# Patient Record
Sex: Male | Born: 1985 | Race: White | Hispanic: No | Marital: Single | Smoking: Never smoker
Health system: Southern US, Community
[De-identification: ages and names within clinical notes are randomized; demographics above are authoritative.]

## PROBLEM LIST (undated history)

## (undated) DIAGNOSIS — S060XAA Concussion with loss of consciousness status unknown, initial encounter: Secondary | ICD-10-CM

## (undated) DIAGNOSIS — S060X9A Concussion with loss of consciousness of unspecified duration, initial encounter: Secondary | ICD-10-CM

## (undated) DIAGNOSIS — D239 Other benign neoplasm of skin, unspecified: Secondary | ICD-10-CM

## (undated) DIAGNOSIS — I861 Scrotal varices: Secondary | ICD-10-CM

## (undated) DIAGNOSIS — K219 Gastro-esophageal reflux disease without esophagitis: Secondary | ICD-10-CM

## (undated) HISTORY — DX: Gastro-esophageal reflux disease without esophagitis: K21.9

## (undated) HISTORY — DX: Other benign neoplasm of skin, unspecified: D23.9

## (undated) HISTORY — DX: Concussion with loss of consciousness status unknown, initial encounter: S06.0XAA

## (undated) HISTORY — DX: Concussion with loss of consciousness of unspecified duration, initial encounter: S06.0X9A

## (undated) HISTORY — DX: Scrotal varices: I86.1

---

## 2009-02-20 ENCOUNTER — Ambulatory Visit: Payer: Self-pay | Admitting: Family Medicine

## 2009-02-20 DIAGNOSIS — D239 Other benign neoplasm of skin, unspecified: Secondary | ICD-10-CM

## 2009-02-20 DIAGNOSIS — K219 Gastro-esophageal reflux disease without esophagitis: Secondary | ICD-10-CM | POA: Insufficient documentation

## 2009-02-20 DIAGNOSIS — I861 Scrotal varices: Secondary | ICD-10-CM

## 2009-02-20 HISTORY — DX: Scrotal varices: I86.1

## 2009-02-20 HISTORY — DX: Other benign neoplasm of skin, unspecified: D23.9

## 2009-02-20 HISTORY — DX: Gastro-esophageal reflux disease without esophagitis: K21.9

## 2010-02-10 ENCOUNTER — Encounter: Payer: Self-pay | Admitting: Family Medicine

## 2010-02-10 ENCOUNTER — Ambulatory Visit: Payer: Self-pay | Admitting: Family Medicine

## 2010-02-12 LAB — CONVERTED CEMR LAB
AST: 27 units/L (ref 0–37)
Alkaline Phosphatase: 58 units/L (ref 39–117)
Basophils Absolute: 0 10*3/uL (ref 0.0–0.1)
Basophils Relative: 0.5 % (ref 0.0–3.0)
Bilirubin, Direct: 0.1 mg/dL (ref 0.0–0.3)
CO2: 30 meq/L (ref 19–32)
Calcium: 9.9 mg/dL (ref 8.4–10.5)
Creatinine, Ser: 1 mg/dL (ref 0.4–1.5)
Eosinophils Absolute: 0.2 10*3/uL (ref 0.0–0.7)
GC Probe Amp, Urine: NEGATIVE
GFR calc non Af Amer: 100.18 mL/min (ref >60.00)
HDL: 45.7 mg/dL (ref >39.00)
HIV: NONREACTIVE
Lymphocytes Relative: 31.7 % (ref 12.0–46.0)
MCHC: 34.8 g/dL (ref 30.0–36.0)
Monocytes Relative: 6.9 % (ref 3.0–12.0)
Neutrophils Relative %: 58.4 % (ref 43.0–77.0)
RBC: 4.83 M/uL (ref 4.22–5.81)
RDW: 12.7 % (ref 11.5–14.6)
Total CHOL/HDL Ratio: 4
Triglycerides: 51 mg/dL (ref 0.0–149.0)
VLDL: 10.2 mg/dL (ref 0.0–40.0)

## 2010-03-27 NOTE — Assessment & Plan Note (Signed)
Summary: cpx---will fast//ccm   Vital Signs:  Patient profile:   25 year old male Weight:      158 pounds BMI:     11.47 Temp:     68.25 degrees F oral Pulse rate:   80 / minute Pulse rhythm:   regular Resp:     12 per minute BP sitting:   102 / 80  (left arm) Cuff size:   regular  Vitals Entered By: Sid Falcon LPN (February 10, 2010 11:30 AM) CC: CPX   History of Present Illness: Here for CPE.  just returned from trip to [place]. Had transient fever there but none since then. Received hepatitis A, typhoid vaccine, and yellow fever vaccine prior to departure. Last tetanus 2007. Took malaria prophylaxis but had intolerance and does not take consistently during the trip.  Patient takes no regular medications. History of mild GERD in past. Request STD testing. No discharge or rash  Clinical Review Panels:  Immunizations   Last Tetanus Booster:  Historical (02/23/2005)   Allergies (verified): No Known Drug Allergies  Past History:  Past Medical History: Last updated: 02/20/2009 Chicken pox GERD/Heartburn [facility]  Family History: Last updated: 02/20/2009 Family History of Arthritis, both parents Breast cancer, grandmother Elevated cholesterol Sudden death, grandfather Emotional illness, father Diabetes, father  Social History: Last updated: 02/20/2009 Occupation:  Archivist Single Never Smoked Alcohol use-yes Regular exercise-yes  Risk Factors: Exercise: yes (02/20/2009)  Risk Factors: Smoking Status: never (02/20/2009) PMH-FH-SH reviewed for relevance  Review of Systems  The patient denies anorexia, fever, weight loss, weight gain, vision loss, decreased hearing, hoarseness, chest pain, syncope, dyspnea on exertion, peripheral edema, prolonged cough, headaches, hemoptysis, abdominal pain, melena, hematochezia, severe indigestion/heartburn, hematuria, incontinence, genital sores, muscle weakness, suspicious skin lesions, transient  blindness, difficulty walking, depression, unusual weight change, abnormal bleeding, enlarged lymph nodes, and testicular masses.    Physical Exam  General:  Well-developed,well-nourished,in no acute distress; alert,appropriate and cooperative throughout examination Head:  Normocephalic and atraumatic without obvious abnormalities. No apparent alopecia or balding. Eyes:  No corneal or conjunctival inflammation noted. EOMI. Perrla. Funduscopic exam benign, without hemorrhages, exudates or papilledema. Vision grossly normal. Ears:  External ear exam shows no significant lesions or deformities.  Otoscopic examination reveals clear canals, tympanic membranes are intact bilaterally without bulging, retraction, inflammation or discharge. Hearing is grossly normal bilaterally. Mouth:  Oral mucosa and oropharynx without lesions or exudates.  Teeth in good repair. Neck:  No deformities, masses, or tenderness noted. Lungs:  Normal respiratory effort, chest expands symmetrically. Lungs are clear to auscultation, no crackles or wheezes. Heart:  Normal rate and regular rhythm. S1 and S2 normal without gallop, murmur, click, rub or other extra sounds. Abdomen:  Bowel sounds positive,abdomen soft and non-tender without masses, organomegaly or hernias noted. Genitalia:  Testes bilaterally descended without nodularity, tenderness or masses. No scrotal masses or lesions. No penis lesions or urethral discharge. Msk:  No deformity or scoliosis noted of thoracic or lumbar spine.   Extremities:  No clubbing, cyanosis, edema, or deformity noted with normal full range of motion of all joints.   Neurologic:  alert & oriented X3, cranial nerves II-XII intact, and strength normal in all extremities.   Skin:  no rashes and no suspicious lesions.   Cervical Nodes:  No lymphadenopathy noted Psych:  normally interactive, good eye contact, not anxious appearing, and not depressed appearing.     Impression &  Recommendations:  Problem # 1:  Preventive Health Care (ICD-V70.0) obtain screening labs  and add STD screening test. Discussed prevention of STD  Other Orders: TLB-Lipid Panel (80061-LIPID) TLB-BMP (Basic Metabolic Panel-BMET) (80048-METABOL) TLB-CBC Platelet - w/Differential (85025-CBCD) TLB-Hepatic/Liver Function Pnl (80076-HEPATIC) TLB-TSH (Thyroid Stimulating Hormone) (84443-TSH) T-HIV Antibody  (Reflex) [id number]) T-RPR (Syphilis) (69629-52841) T-GC Probe, urine (32440-10272) T-Chlamydia  Probe, urine (53664-40347) Specimen Handling (42595) Venipuncture (63875)   Orders Added: 1)  Est. Patient 18-39 years [99395] 2)  TLB-Lipid Panel [80061-LIPID] 3)  TLB-BMP (Basic Metabolic Panel-BMET) [80048-METABOL] 4)  TLB-CBC Platelet - w/Differential [85025-CBCD] 5)  TLB-Hepatic/Liver Function Pnl [80076-HEPATIC] 6)  TLB-TSH (Thyroid Stimulating Hormone) [84443-TSH] 7)  T-HIV Antibody  (Reflex) [[id number]] 8)  T-RPR (Syphilis) [41660-63016] 9)  T-GC Probe, urine [phone] 10)  T-Chlamydia  Probe, urine [[id number]] 11)  Specimen Handling [99000] 12)  Venipuncture [62376]

## 2011-06-19 ENCOUNTER — Ambulatory Visit (INDEPENDENT_AMBULATORY_CARE_PROVIDER_SITE_OTHER): Payer: 59 | Admitting: Family Medicine

## 2011-06-19 ENCOUNTER — Encounter: Payer: Self-pay | Admitting: Family Medicine

## 2011-06-19 DIAGNOSIS — S060XAA Concussion with loss of consciousness status unknown, initial encounter: Secondary | ICD-10-CM

## 2011-06-19 DIAGNOSIS — S060X9A Concussion with loss of consciousness of unspecified duration, initial encounter: Secondary | ICD-10-CM

## 2011-06-19 NOTE — Patient Instructions (Signed)
Concussion and Brain Injury A blow or jolt to the head can disrupt the normal function of the brain. This type of brain injury is often called a "concussion" or a "closed head injury." Concussions are usually not life-threatening. Even so, the effects of a concussion can be serious.  CAUSES  A concussion is caused by a blunt blow to the head. The blow might be direct or indirect as described below.  Direct blow (running into another player during a soccer game, being hit in a fight, or hitting your head on a hard surface).   Indirect blow (when your head moves rapidly and violently back and forth like in a car crash).  SYMPTOMS  The brain is very complex. Every head injury is different. Some symptoms may appear right away. Other symptoms may not show up for days or weeks after the concussion. The signs of concussion can be hard to notice. Early on, problems may be missed by patients, family members, and caregivers. You may look fine even though you are acting or feeling differently.  These symptoms are usually temporary, but may last for days, weeks, or even longer. Symptoms include:  Mild headaches that will not go away.   Having more trouble than usual with:   Remembering things.   Paying attention or concentrating.   Organizing daily tasks.   Making decisions and solving problems.   Slowness in thinking, acting, speaking, or reading.   Getting lost or easily confused.   Feeling tired all the time or lacking energy (fatigue).   Feeling drowsy.   Sleep disturbances.   Sleeping more than usual.   Sleeping less than usual.   Trouble falling asleep.   Trouble sleeping (insomnia).   Loss of balance or feeling lightheaded or dizzy.   Nausea or vomiting.   Numbness or tingling.   Increased sensitivity to:   Sounds.   Lights.   Distractions.  Other symptoms might include:  Vision problems or eyes that tire easily.   Diminished sense of taste or smell.   Ringing  in the ears.   Mood changes such as feeling sad, anxious, or listless.   Becoming easily irritated or angry for little or no reason.   Lack of motivation.  DIAGNOSIS  Your caregiver can usually diagnose a concussion or mild brain injury based on your description of your injury and your symptoms.  Your evaluation might include:  A brain scan to look for signs of injury to the brain. Even if the test shows no injury, you may still have a concussion.   Blood tests to be sure other problems are not present.  TREATMENT   People with a concussion need to be examined and evaluated. Most people with concussions are treated in an emergency department, urgent care, or clinic. Some people must stay in the hospital overnight for further treatment.   Your caregiver will send you home with important instructions to follow. Be sure to carefully follow them.   Tell your caregiver if you are already taking any medicines (prescription, over-the-counter, or natural remedies), or if you are drinking alcohol or taking illegal drugs. Also, talk with your caregiver if you are taking blood thinners (anticoagulants) or aspirin. These drugs may increase your chances of complications. All of this is important information that may affect treatment.   Only take over-the-counter or prescription medicines for pain, discomfort, or fever as directed by your caregiver.  PROGNOSIS  How fast people recover from brain injury varies from person to person.   Although most people have a good recovery, how quickly they improve depends on many factors. These factors include how severe their concussion was, what part of the brain was injured, their age, and how healthy they were before the concussion.  Because all head injuries are different, so is recovery. Most people with mild injuries recover fully. Recovery can take time. In general, recovery is slower in older persons. Also, persons who have had a concussion in the past or have  other medical problems may find that it takes longer to recover from their current injury. Anxiety and depression may also make it harder to adjust to the symptoms of brain injury. HOME CARE INSTRUCTIONS  Return to your normal activities slowly, not all at once. You must give your body and brain enough time for recovery.  Get plenty of sleep at night, and rest during the day. Rest helps the brain to heal.   Avoid staying up late at night.   Keep the same bedtime hours on weekends and weekdays.   Take daytime naps or rest breaks when you feel tired.   Limit activities that require a lot of thought or concentration (brain or cognitive rest). This includes:   Homework or job-related work.   Watching TV.   Computer work.   Avoid activities that could lead to a second brain injury, such as contact or recreational sports, until your caregiver says it is okay. Even after your brain injury has healed, you should protect yourself from having another concussion.   Ask your caregiver when you can return to your normal activities such as driving, bicycling, or operating heavy equipment. Your ability to react may be slower after a brain injury.   Talk with your caregiver about when you can return to work or school.   Inform your teachers, school nurse, school counselor, coach, athletic trainer, or work manager about your injury, symptoms, and restrictions. They should be instructed to report:   Increased problems with attention or concentration.   Increased problems remembering or learning new information.   Increased time needed to complete tasks or assignments.   Increased irritability or decreased ability to cope with stress.   Increased symptoms.   Take only those medicines that your caregiver has approved.   Do not drink alcohol until your caregiver says you are well enough to do so. Alcohol and certain other drugs may slow your recovery and can put you at risk of further injury.    If it is harder than usual to remember things, write them down.   If you are easily distracted, try to do one thing at a time. For example, do not try to watch TV while fixing dinner.   Talk with family members or close friends when making important decisions.   Keep all follow-up appointments. Repeated evaluation of your symptoms is recommended for your recovery.  PREVENTION  Protect your head from future injury. It is very important to avoid another head or brain injury before you have recovered. In rare cases, another injury has lead to permanent brain damage, brain swelling, or death. Avoid injuries by using:  Seatbelts when riding in a car.   Alcohol only in moderation.   A helmet when biking, skiing, skateboarding, skating, or doing similar activities.   Safety measures in your home.   Remove clutter and tripping hazards from floors and stairways.   Use grab bars in bathrooms and handrails by stairs.   Place non-slip mats on floors and in bathtubs.     Improve lighting in dim areas.  SEEK MEDICAL CARE IF:  A head injury can cause lingering symptoms. You should seek medical care if you have any of the following symptoms for more than 3 weeks after your injury or are planning to return to sports:  Chronic headaches.   Dizziness or balance problems.   Nausea.   Vision problems.   Increased sensitivity to noise or light.   Depression or mood swings.   Anxiety or irritability.   Memory problems.   Difficulty concentrating or paying attention.   Sleep problems.   Feeling tired all the time.  SEEK IMMEDIATE MEDICAL CARE IF:  You have had a blow or jolt to the head and you (or your family or friends) notice:  Severe or worsening headaches.   Weakness (even if only in one hand or one leg or one part of the face), numbness, or decreased coordination.   Repeated vomiting.   Increased sleepiness or passing out.   One black center of the eye (pupil) is larger  than the other.   Convulsions (seizures).   Slurred speech.   Increasing confusion, restlessness, agitation, or irritability.   Lack of ability to recognize people or places.   Neck pain.   Difficulty being awakened.   Unusual behavior changes.   Loss of consciousness.  Older adults with a brain injury may have a higher risk of serious complications such as a blood clot on the brain. Headaches that get worse or an increase in confusion are signs of this complication. If these signs occur, see a caregiver right away. MAKE SURE YOU:   Understand these instructions.   Will watch your condition.   Will get help right away if you are not doing well or get worse.  FOR MORE INFORMATION  Several groups help people with brain injury and their families. They provide information and put people in touch with local resources. These include support groups, rehabilitation services, and a variety of health care professionals. Among these groups, the Brain Injury Association (BIA, www.biausa.org) has a national office that gathers scientific and educational information and works on a national level to help people with brain injury.  Document Released: 05/02/2003 Document Revised: 01/29/2011 Document Reviewed: 09/28/2007 [employer] Patient Information 2012 [employer]. 

## 2011-06-19 NOTE — Progress Notes (Signed)
  Subjective:    Patient ID: Omar Green, male    DOB: May 05, 1985, 26 y.o.   MRN: [id number]  HPI  Acute visit for head trauma. Participating in a martial arts class last night and fell back striking occipital region of head against the floor. No loss of consciousness. Felt somewhat dazed afterwards. No amnesia. Since then has had some fatigue and slow cognitive function. He had some headache this morning but none currently. He denies any nausea, vomiting, visual difficulties, seizure, or any other focal weakness. He has some generalized neck soreness but none specifically localized over the spine. No prior history of concussion. He tried some reading and studying today but feels slower cognitively. He has not taken anything for pain.  Past medical history is unremarkable. No chronic medical problems. Takes no regular medications.  Review of Systems  Constitutional: Negative for appetite change.  HENT: Negative for neck stiffness.   Eyes: Negative for visual disturbance.  Respiratory: Negative for shortness of breath.   Cardiovascular: Negative for chest pain.  Genitourinary: Negative for dysuria.  Neurological: Negative for seizures, syncope, weakness and light-headedness.  Hematological: Negative for adenopathy.  Psychiatric/Behavioral: Negative for confusion.       Objective:   Physical Exam  Constitutional: He is oriented to person, place, and time. He appears well-developed and well-nourished.  HENT:  Head: Normocephalic and atraumatic.  Right Ear: External ear normal.  Left Ear: External ear normal.  Mouth/Throat: Oropharynx is clear and moist.  Neck: Neck supple. No thyromegaly present.  Cardiovascular: Normal rate and regular rhythm.   Pulmonary/Chest: Effort normal and breath sounds normal.  Lymphadenopathy:    He has no cervical adenopathy.  Neurological: He is alert and oriented to person, place, and time. No cranial nerve deficit. Coordination normal.  Psychiatric: He  has a normal mood and affect. His behavior is normal. Judgment and thought content normal.          Assessment & Plan:  Status post concussion. Patient has some residual symptoms of fatigue and slowed cognitive function. Nonfocal neurologic exam. Handout concussion given. We've advised physical and mental rest for the next several days and have given him guidance for gradually advancing activities. No contact sports until symptoms are fully resolved for several days. Follow up promptly for any worsening headache, vomiting, seizure, or any worsening symptoms

## 2011-06-22 ENCOUNTER — Telehealth: Payer: Self-pay | Admitting: Family Medicine

## 2011-06-22 NOTE — Telephone Encounter (Signed)
If no headache, vomiting,or any new neurologic symptoms no further evaluation needed at this time.  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

## 2011-06-22 NOTE — Telephone Encounter (Signed)
Has questions about her son and is requesting to be contacted

## 2011-06-22 NOTE — Telephone Encounter (Signed)
Pt was seen last week for head injury.  He has seemed sleepy lately.  Mother wondering if there is any other follow-up that is required for his injury.  Other than sleepy, he seems normal to mother.

## 2011-06-22 NOTE — Telephone Encounter (Signed)
Pt mother informed

## 2011-07-10 ENCOUNTER — Encounter: Payer: Self-pay | Admitting: Family Medicine

## 2011-07-10 ENCOUNTER — Ambulatory Visit (INDEPENDENT_AMBULATORY_CARE_PROVIDER_SITE_OTHER): Payer: 59 | Admitting: Family Medicine

## 2011-07-10 ENCOUNTER — Telehealth: Payer: Self-pay | Admitting: Family Medicine

## 2011-07-10 VITALS — BP 90/60 | Temp 98.2°F | Wt 156.0 lb

## 2011-07-10 DIAGNOSIS — F0781 Postconcussional syndrome: Secondary | ICD-10-CM

## 2011-07-10 NOTE — Patient Instructions (Signed)
Touch base by next week if symptoms do not continue to improve.

## 2011-07-10 NOTE — Telephone Encounter (Signed)
Please advise 

## 2011-07-10 NOTE — Progress Notes (Signed)
  Subjective:    Patient ID: Omar Green, male    DOB: [birth year], 26 y.o.   MRN: [id number]  HPI  Followup from recent concussion. Refer to prior note. Patient had injury during martial arts class. No loss of consciousness. He had some very mild bifrontal headaches which are inconsistent and not daily. He feels he still has some slow cognitive function but overall this is improving. He has not done any extreme exertion but has been some mild exertion which seems to exacerbate his symptoms. Generally had poor sleep habits onset. No nausea or vomiting. No major confusion. No focal weakness. He has not taken anything for sleep. He has not engaged in any high risk contact activities and knows to avoid this.  Past Medical History  Diagnosis Date  . 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 GERD 02/20/2009    Qualifier: Diagnosis of  By: Gabriel Rung LPN, Harriett Sine     No past surgical history on file.  reports that he has never smoked. He does not have any smokeless tobacco history on file. His alcohol and drug histories not on file. family history includes Arthritis in his father and mother; Diabetes in his father; Mental illness in his father; and Stroke in his other. No Known Allergies   Review of Systems  Constitutional: Negative for appetite change and unexpected weight change.  Respiratory: Negative for shortness of breath.   Gastrointestinal: Negative for nausea and vomiting.  Neurological: Positive for headaches (infrequent bifrontal headaches which are mild). Negative for dizziness, seizures, syncope and weakness.  Psychiatric/Behavioral: Negative for confusion.       Objective:   Physical Exam  Constitutional: He is oriented to person, place, and time. He appears well-developed and well-nourished.  Eyes: Pupils are equal, round, and reactive to light.  Cardiovascular:  Normal rate and regular rhythm.   Pulmonary/Chest: Effort normal and breath sounds normal. No respiratory distress. He has no wheezes.  Neurological: He is alert and oriented to person, place, and time. He has normal reflexes. No cranial nerve deficit. Coordination normal.          Assessment & Plan:  Postconcussive symptoms. Nonfocal neurologic exam. He has some associated mild sleep disturbance. We have reiterated the importance of avoiding extreme exertion either mentally or physically. We have suggested consideration for short-term trial of try cyclic such as Elavil but at this point he wishes to wait since he is showing some improvement. We did not see any clear reason for CT scan at this time as he has not had any new symptoms. He is improving, though slowly

## 2011-07-10 NOTE — Telephone Encounter (Signed)
Pt was seen a few weeks ago for a concussion and is still having symptoms wants to know if he should have a scan done.

## 2011-07-10 NOTE — Telephone Encounter (Signed)
What are his symptoms?

## 2011-07-10 NOTE — Telephone Encounter (Signed)
Pt having symptoms when driving mostly when dizziness when driving, any exertion causes.  Pt is going to try to find a ride for a 3:30 appt this afternoon.  If he cannot make it, he will cancel and come in next week.

## 2012-01-28 ENCOUNTER — Ambulatory Visit (INDEPENDENT_AMBULATORY_CARE_PROVIDER_SITE_OTHER): Payer: BC Managed Care – PPO | Admitting: Family Medicine

## 2012-01-28 ENCOUNTER — Encounter: Payer: Self-pay | Admitting: Family Medicine

## 2012-01-28 VITALS — BP 98/70 | Temp 98.3°F | Wt 164.0 lb

## 2012-01-28 DIAGNOSIS — K602 Anal fissure, unspecified: Secondary | ICD-10-CM

## 2012-01-28 DIAGNOSIS — F0781 Postconcussional syndrome: Secondary | ICD-10-CM

## 2012-01-28 MED ORDER — NORTRIPTYLINE HCL 10 MG PO CAPS: 10.0000 mg | ORAL_CAPSULE | Freq: Every day | ORAL | Status: DC

## 2012-01-28 NOTE — Patient Instructions (Addendum)
Anal Fissure, Adult An anal fissure is a small tear or crack in the skin around the anus. Bleeding from a fissure usually stops on its own within a few minutes. However, bleeding will often reoccur with each bowel movement until the crack heals.  CAUSES   Passing large, hard stools.  Frequent diarrheal stools.  Constipation.  Inflammatory bowel disease (Crohn's disease or ulcerative colitis).  Infections.  Anal sex. SYMPTOMS   Small amounts of blood seen on your stools, on toilet paper, or in the toilet after a bowel movement.  Rectal bleeding.  Painful bowel movements.  Itching or irritation around the anus. DIAGNOSIS Your caregiver will examine the anal area. An anal fissure can usually be seen with careful inspection. A rectal exam may be performed and a short tube (anoscope) may be used to examine the anal canal. TREATMENT   You may be instructed to take fiber supplements. These supplements can soften your stool to help make bowel movements easier.  Sitz baths may be recommended to help heal the tear. Do not use soap in the sitz baths.  A medicated cream or ointment may be prescribed to lessen discomfort. HOME CARE INSTRUCTIONS   Maintain a diet high in fruits, whole grains, and vegetables. Avoid constipating foods like bananas and dairy products.  Take sitz baths as directed by your caregiver.  Drink enough fluids to keep your urine clear or pale yellow.  Only take over-the-counter or prescription medicines for pain, discomfort, or fever as directed by your caregiver. Do not take aspirin as this may increase bleeding.  Do not use ointments containing numbing medications (anesthetics) or hydrocortisone. They could slow healing. SEEK MEDICAL CARE IF:   Your fissure is not completely healed within 3 days.  You have further bleeding.  You have a fever.  You have diarrhea mixed with blood.  You have pain.  Your problem is getting worse rather than  better. MAKE SURE YOU:   Understand these instructions.  Will watch your condition.  Will get help right away if you are not doing well or get worse. Document Released: 02/09/2005 Document Revised: 05/04/2011 Document Reviewed: 07/27/2010 [facility] Patient Information 2013 [place], [place].  If doing the above and still straining, consider stool softener.  Take nortriptyline at night. Touch base in 2-3 weeks if headaches no better.   If headaches resolved with medication, consider stopping in 6-8 weeks.

## 2012-01-28 NOTE — Progress Notes (Signed)
  Subjective:    Patient ID: Omar Green, male    DOB: 26-Apr-1985, 26 y.o.   MRN: [id number]  HPI  Patient seen for the following issues  Bright red blood per rectum for one month. Intermittent. Occasional mild constipation. Occasional mild pain with stools. No appetite changes. Some recent mild weight gain. No abdominal pain. No aspirin use. No history of external hemorrhoids  Patient had concussion last spring with martial arts injury. Overall improving but still is having some occasional headaches sometimes several days consecutive. He has not had any vomiting. No focal neurologic symptoms. He had some slow thinking and cognitive impairment initially and this has gradually improved. Sometimes relieved with Tylenol. Fairly diffuse.  Past Medical History  Diagnosis Date  . 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 GERD 02/20/2009    Qualifier: Diagnosis of  By: Gabriel Rung LPN, Harriett Sine     No past surgical history on file.  reports that he has never smoked. He does not have any smokeless tobacco history on file. His alcohol and drug histories not on file. family history includes Arthritis in his father and mother; Diabetes in his father; Mental illness in his father; and Stroke in his other. No Known Allergies   Review of Systems  Constitutional: Negative for fever, chills, appetite change and fatigue.  Respiratory: Negative for cough and shortness of breath.   Cardiovascular: Negative for chest pain.  Gastrointestinal: Positive for blood in stool. Negative for nausea, vomiting, diarrhea and abdominal distention.  Genitourinary: Negative for hematuria.  Neurological: Positive for headaches. Negative for dizziness, tremors, seizures, syncope, weakness and numbness.  Hematological: Negative for adenopathy. Does not bruise/bleed easily.  Psychiatric/Behavioral: Negative for  dysphoric mood.       Objective:   Physical Exam  Constitutional: He is oriented to person, place, and time. He appears well-developed and well-nourished.  Cardiovascular: Normal rate and regular rhythm.   Pulmonary/Chest: Effort normal and breath sounds normal. No respiratory distress. He has no wheezes. He has no rales.  Genitourinary:       Patient has anal fissure 12:00 position. Slight bleeding at this time which is bright red. Did not attempt digital exam. No external hemorrhoids.  Neurological: He is alert and oriented to person, place, and time. No cranial nerve deficit.  Psychiatric: He has a normal mood and affect. His behavior is normal. Judgment and thought content normal.          Assessment & Plan:  #1 anal fissure. Consider sitz baths. Measures to reduce constipation. Consider short-term use of stool softener. Avoid topical steroid creams which could delay healing. No aspirin use. Touch base 2-3 weeks if not resolving. He has minimal pain so no topical medications such as nitroglycerin or diltiazem given  #2 history of concussion. Overall improved but still having frequent headaches which we think might be related vs chronic tension type headache. Nonfocal neurologic history and exam. We discussed avoidance of daily analgesics. Try short-term use of nortriptyline 10 mg each bedtime and touch base 2-3 weeks if headaches not resolving

## 2012-02-15 ENCOUNTER — Telehealth: Payer: Self-pay | Admitting: Family Medicine

## 2012-02-15 NOTE — Telephone Encounter (Signed)
Patient was placed on Nortriptyline 10mg  and was instructed to follow up with provider in 2 weeks to provide update on how the medication is working.  Patient states improvement on medication and wants to know if he needs to continue medication longer or just complete the remainder of previous script. Patient has 16 capsules remaining.   OFFICE NOTE: PLEASE FOLLOW UP WITH PATIENT

## 2012-02-15 NOTE — Telephone Encounter (Signed)
I would plan to take for at least 2-3 months total and then try to leave off.  There would not be any weaning at that time since this is lowest dose.

## 2012-02-16 NOTE — Telephone Encounter (Signed)
Pt informed on personally identified VM 

## 2012-04-21 ENCOUNTER — Ambulatory Visit (INDEPENDENT_AMBULATORY_CARE_PROVIDER_SITE_OTHER): Payer: BC Managed Care – PPO | Admitting: Family Medicine

## 2012-04-21 ENCOUNTER — Encounter: Payer: Self-pay | Admitting: Family Medicine

## 2012-04-21 VITALS — BP 120/71 | HR 80 | Ht 70.0 in | Wt 155.0 lb

## 2012-04-21 DIAGNOSIS — M79609 Pain in unspecified limb: Secondary | ICD-10-CM

## 2012-04-21 DIAGNOSIS — R269 Unspecified abnormalities of gait and mobility: Secondary | ICD-10-CM

## 2012-04-21 DIAGNOSIS — M2140 Flat foot [pes planus] (acquired), unspecified foot: Secondary | ICD-10-CM

## 2012-04-21 DIAGNOSIS — M79604 Pain in right leg: Secondary | ICD-10-CM

## 2012-04-21 NOTE — Patient Instructions (Addendum)
Use new orthotics in as many shoes as possible. Tylenol and/or ibuprofen as needed for pain. Ice as needed 15 minutes at a time. Do the straight leg raise, straight leg raise with foot turned out, hip side raise, decline squats, and hacky sack exercises 3 sets of 10 once a day. Can add ankle weights if needed. Follow up with me in 1 month or as needed.

## 2012-04-25 ENCOUNTER — Encounter: Payer: Self-pay | Admitting: Family Medicine

## 2012-04-25 DIAGNOSIS — M2140 Flat foot [pes planus] (acquired), unspecified foot: Secondary | ICD-10-CM | POA: Insufficient documentation

## 2012-04-25 DIAGNOSIS — R269 Unspecified abnormalities of gait and mobility: Secondary | ICD-10-CM | POA: Insufficient documentation

## 2012-04-25 DIAGNOSIS — M79605 Pain in left leg: Secondary | ICD-10-CM | POA: Insufficient documentation

## 2012-04-25 DIAGNOSIS — M79604 Pain in right leg: Secondary | ICD-10-CM | POA: Insufficient documentation

## 2012-04-25 NOTE — Assessment & Plan Note (Signed)
2/2 medial tibial stress syndrome - ultrasound today confirmed lack of findings for stress fracture.  New orthotics made for this, pes planus, gait abnormality as a result of pes planus.  Reviewed HEP for pes bursitis, patellofemoral syndrome, patellar tendinitis he occasionally has pain from.  Patient was fitted for a : standard, cushioned, semi-rigid orthotic. The orthotic was heated and afterward the patient stood on the orthotic blank positioned on the orthotic stand. The patient was positioned in subtalar neutral position and 10 degrees of ankle dorsiflexion in a weight bearing stance. After completion of molding, a stable base was applied to the orthotic blank. The blank was ground to a stable position for weight bearing. Blue swirl Base: blue med density EVA Posting: 1st ray posts for stability. Additional orthotic padding: none Total prep time 45 minutes.

## 2012-04-25 NOTE — Progress Notes (Signed)
Subjective:    Patient ID: Omar Green, male    DOB: 02/12/1986, 27 y.o.   MRN: [id number]  PCP: Dr. Caryl Never  HPI 27 yo M here for bilateral shin splints.  Patient reports he has had shin splints for years - responded well to orthotics but his current ones are worn down and he would like a new pair. On feet a lot as he works at [employer]. Runs some but mostly cycles for exercise. Pain is over a wide area of shins L > R. No swelling or bruising. Gets some pain in knees around pes area from where he points.  Past Medical History  Diagnosis Date  . 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 GERD 02/20/2009    Qualifier: Diagnosis of  By: Gabriel Rung LPN, Harriett Sine    . Concussion     Current Outpatient Prescriptions on File Prior to Visit  Medication Sig Dispense Refill  . nortriptyline (PAMELOR) 10 MG capsule Take 1 capsule (10 mg total) by mouth at bedtime.  30 capsule  3   No current facility-administered medications on file prior to visit.    History reviewed. No pertinent past surgical history.  No Known Allergies  History   Social History  . Marital Status: Single    Spouse Name: N/A    Number of Children: N/A  . Years of Education: N/A   Occupational History  . Not on file.   Social History Main Topics  . Smoking status: Never Smoker   . Smokeless tobacco: Not on file  . Alcohol Use: Not on file  . Drug Use: Not on file  . Sexually Active: Not on file   Other Topics Concern  . Not on file   Social History Narrative  . No narrative on file    Family History  Problem Relation Age of Onset  . Arthritis Mother   . Hyperlipidemia Mother   . Hypertension Mother   . Arthritis Father   . Mental illness Father   . Diabetes Father   . Hypertension Father   . Hyperlipidemia Father   . Stroke Other     sudden death  . Heart attack Neg  Hx     BP 120/71  Pulse 80  Ht 5\' 10"  (1.778 m)  Wt 155 lb (70.308 kg)  BMI 22.24 kg/m2  Review of Systems See HPI above.    Objective:   Physical Exam Gen: NAD  Bilateral lower legs: Pes planus. No gross deformity, swelling Very mild TTP medial tibial borders and right pes anserine areas. FROM knees and ankles without pain.  Bilateral feet: Pes planus. No callus under MT heads. No hallux valgus or rigidus. Leg lengths equal.    Assessment & Plan:  1. Bilateral lower leg pain - 2/2 medial tibial stress syndrome - ultrasound today confirmed lack of findings for stress fracture.  New orthotics made for this, pes planus, gait abnormality as a result of pes planus.  Reviewed HEP for pes bursitis, patellofemoral syndrome, patellar tendinitis he occasionally has pain from.  Patient was fitted for a : standard, cushioned, semi-rigid orthotic. The orthotic was heated and afterward the patient stood on the orthotic blank positioned on the orthotic stand. The patient was positioned in subtalar neutral position and 10 degrees of ankle dorsiflexion in a weight bearing stance. After completion of  molding, a stable base was applied to the orthotic blank. The blank was ground to a stable position for weight bearing. Blue swirl Base: blue med density EVA Posting: 1st ray posts for stability. Additional orthotic padding: none Total prep time 45 minutes.

## 2012-06-13 ENCOUNTER — Telehealth: Payer: Self-pay | Admitting: Family Medicine

## 2012-06-13 NOTE — Telephone Encounter (Signed)
Patient Information:  Caller Name: Kahari  Phone: [phone]  Patient: Omar Green, Omar Green  Gender: Male  DOB: [birth year]  Age: 27 Years  PCP: Evelena Peat [facility])  Office Follow Up:  Does the office need to follow up with this patient?: No  Instructions For The Office: N/A  RN Note:  Has had dizziness, lightheadedness and Headache for last 3 weeks that are getting more frequent. Patient has been outside more, watching screens make it worse, or in a busy environment.  Sometimes the symptoms interfere with reading and eye use.   Intake/output normal for patient. Is able to do daily activities. Headaches happen in the area where patient experienced a concussion about a year ago.  Back toward left side with a level of pain that varies from an 8 but usually a 5-6 on 10 point pain scale.  Quality of the pain feels like a dull low background pain.  Feels like there is a weight difference on the affected size and may be swollen.  Care advice given with caller demonstrating understanding.  Appointment scheduled 4/22 at 09:45 with Dr. Caryl Never.  Symptoms  Reason For Call & Symptoms: Dizzy and lightheaded with headaches  Nortriptyline  Reviewed Health History In EMR: Yes  Reviewed Medications In EMR: Yes  Reviewed Allergies In EMR: Yes  Reviewed Surgeries / Procedures: Yes  Date of Onset of Symptoms: 05/23/2012  Guideline(s) Used:  Headache  Disposition Per Guideline:   See Today or Tomorrow in Office  Reason For Disposition Reached:   Unexplained headache that is present > 24 hours  Advice Given:  Rest:   Lie down in a dark, quiet place and try to relax. Close your eyes and imagine your entire body relaxing.  Apply Cold to the Area:   Apply a cold wet washcloth or cold pack to the forehead for 20 minutes.  Stretching:   Stretch and massage any tight neck muscles.  Pain Medicines:  For pain relief, you can take either acetaminophen, ibuprofen, or naproxen.  They are  over-the-counter (OTC) pain drugs. You can buy them at the drugstore.  Acetaminophen (e.g., Tylenol):  Regular Strength Tylenol: Take 650 mg (two 325 mg pills) by mouth every 4-6 hours as needed. Each Regular Strength Tylenol pill has 325 mg of acetaminophen.  Extra Strength Tylenol: Take 1,000 mg (two 500 mg pills) every 8 hours as needed. Each Extra Strength Tylenol pill has 500 mg of acetaminophen.  Ibuprofen (e.g., Motrin, Advil):  Take 400 mg (two 200 mg pills) by mouth every 6 hours.  Another choice is to take 600 mg (three 200 mg pills) by mouth every 8 hours.  The most you should take each day is 1,200 mg (six 200 mg pills), unless your doctor has told you to take more.  Call Back If:  Headache lasts longer than 24 hours  You become worse.  Patient Will Follow Care Advice:  YES  Appointment Scheduled:  06/14/2012 09:45:00 Appointment Scheduled Provider:  Evelena Peat [facility])

## 2012-06-14 ENCOUNTER — Encounter: Payer: Self-pay | Admitting: Family Medicine

## 2012-06-14 ENCOUNTER — Ambulatory Visit (INDEPENDENT_AMBULATORY_CARE_PROVIDER_SITE_OTHER): Payer: BC Managed Care – PPO | Admitting: Family Medicine

## 2012-06-14 VITALS — BP 100/80 | Temp 97.7°F

## 2012-06-14 DIAGNOSIS — R51 Headache: Secondary | ICD-10-CM

## 2012-06-14 NOTE — Progress Notes (Signed)
  Subjective:    Patient ID: Omar Green, male    DOB: [birth year], 27 y.o.   MRN: [id number]  HPI Patient seen with recurrent headaches 1 year ago had martial arts related concussion. Had nonfocal neurologic exam and postconcussive headache eventually did improve. At one point we started nortriptyline 10 mg at night which did seem to help. Has not taken nortriptyline over the past couple of weeks  Current headaches are somewhat different in that they are located unilateral left occipital region. Headaches occur most days a week. Exacerbated by studying. There are described as and mild to moderate intensity. Not throbbing. Occasional nausea but no vomiting. Question mild photophobia. Denies any recent recurrent head injury. No focal weakness. No visual changes. Currently not taking any medications. No regular use of analgesics.  Past Medical History  Diagnosis Date  . 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 GERD 02/20/2009    Qualifier: Diagnosis of  By: Gabriel Rung LPN, Harriett Sine    . Concussion    No past surgical history on file.  reports that he has never smoked. He does not have any smokeless tobacco history on file. His alcohol and drug histories are not on file. family history includes Arthritis in his father and mother; Diabetes in his father; Hyperlipidemia in his father and mother; Hypertension in his father and mother; Mental illness in his father; and Stroke in his other.  There is no history of Heart attack. No Known Allergies   Review of Systems  Constitutional: Negative for fever, chills, appetite change and unexpected weight change.  HENT: Negative for congestion.   Eyes: Negative for visual disturbance.  Respiratory: Negative for shortness of breath.   Cardiovascular: Negative for chest pain.  Neurological: Positive for headaches. Negative for tremors,  seizures, syncope and weakness.  Psychiatric/Behavioral: Negative for confusion.       Objective:   Physical Exam  Constitutional: He is oriented to person, place, and time. He appears well-developed and well-nourished.  HENT:  Right Ear: External ear normal.  Left Ear: External ear normal.  Eyes: Pupils are equal, round, and reactive to light.  Neck: Neck supple. No thyromegaly present.  Cardiovascular: Normal rate and regular rhythm.   Pulmonary/Chest: Effort normal and breath sounds normal. No respiratory distress. He has no wheezes.  Neurological: He is alert and oriented to person, place, and time. No cranial nerve deficit. Coordination normal.  No focal weakness. Cerebellar function normal. Gait normal          Assessment & Plan:  Somewhat atypical headaches. His headaches are unilateral left occipital. Not typical for migraines and these have become almost daily. They also not typical of classic chronic tension-type headaches. These are not clearly related to previous concussion-the he has had intermittent headaches off and on for the past year since his concussion. Obtain CT head without contrast. Consider neurology consult

## 2012-06-16 ENCOUNTER — Ambulatory Visit (INDEPENDENT_AMBULATORY_CARE_PROVIDER_SITE_OTHER)
Admission: RE | Admit: 2012-06-16 | Discharge: 2012-06-16 | Disposition: A | Discharge status: Home / Self Care | Payer: BC Managed Care – PPO | Source: Ambulatory Visit | Attending: Family Medicine | Admitting: Family Medicine

## 2012-06-16 DIAGNOSIS — R51 Headache: Secondary | ICD-10-CM

## 2012-06-17 ENCOUNTER — Telehealth: Payer: Self-pay | Admitting: Family Medicine

## 2012-06-17 ENCOUNTER — Other Ambulatory Visit: Payer: BC Managed Care – PPO

## 2012-06-17 NOTE — Telephone Encounter (Signed)
Pt would like results of CT scan done yesterday. pls call

## 2012-06-19 NOTE — Telephone Encounter (Signed)
I called and left message.  Head CT normal.

## 2012-06-20 NOTE — Telephone Encounter (Signed)
noted 

## 2013-04-19 ENCOUNTER — Encounter: Payer: BC Managed Care – PPO | Admitting: Family Medicine

## 2013-04-21 ENCOUNTER — Encounter: Payer: Self-pay | Admitting: Family Medicine

## 2013-04-21 ENCOUNTER — Ambulatory Visit (INDEPENDENT_AMBULATORY_CARE_PROVIDER_SITE_OTHER): Payer: BC Managed Care – PPO | Admitting: Family Medicine

## 2013-04-21 VITALS — BP 110/70 | HR 53 | Temp 97.4°F | Ht 70.0 in | Wt 158.0 lb

## 2013-04-21 DIAGNOSIS — Z0000 Encounter for general adult medical examination without abnormal findings: Secondary | ICD-10-CM

## 2013-04-21 LAB — BASIC METABOLIC PANEL
BUN: 9 mg/dL (ref 6–23)
CALCIUM: 9.5 mg/dL (ref 8.4–10.5)
CHLORIDE: 104 meq/L (ref 96–112)
CO2: 30 meq/L (ref 19–32)
CREATININE: 0.8 mg/dL (ref 0.4–1.5)
GFR: 119.49 mL/min (ref >60.00)
GLUCOSE: 77 mg/dL (ref 70–99)
Potassium: 4.1 mEq/L (ref 3.5–5.1)
Sodium: 138 mEq/L (ref 135–145)

## 2013-04-21 LAB — CBC WITH DIFFERENTIAL/PLATELET
BASOS ABS: 0 10*3/uL (ref 0.0–0.1)
Basophils Relative: 0.6 % (ref 0.0–3.0)
EOS ABS: 0.2 10*3/uL (ref 0.0–0.7)
Eosinophils Relative: 3.7 % (ref 0.0–5.0)
HCT: 43.5 % (ref 39.0–52.0)
Hemoglobin: 14.4 g/dL (ref 13.0–17.0)
LYMPHS PCT: 39.1 % (ref 12.0–46.0)
Lymphs Abs: 2.3 10*3/uL (ref 0.7–4.0)
MCHC: 33.2 g/dL (ref 30.0–36.0)
MCV: 89.6 fl (ref 78.0–100.0)
MONO ABS: 0.4 10*3/uL (ref 0.1–1.0)
Monocytes Relative: 6.8 % (ref 3.0–12.0)
NEUTROS PCT: 49.8 % (ref 43.0–77.0)
Neutro Abs: 2.9 10*3/uL (ref 1.4–7.7)
Platelets: 188 10*3/uL (ref 150.0–400.0)
RBC: 4.86 Mil/uL (ref 4.22–5.81)
RDW: 13 % (ref 11.5–14.6)
WBC: 5.8 10*3/uL (ref 4.5–10.5)

## 2013-04-21 LAB — LIPID PANEL
CHOL/HDL RATIO: 3
Cholesterol: 184 mg/dL (ref 0–200)
HDL: 57.4 mg/dL (ref >39.00)
LDL Cholesterol: 119 mg/dL — ABNORMAL HIGH (ref 0–99)
TRIGLYCERIDES: 37 mg/dL (ref 0.0–149.0)
VLDL: 7.4 mg/dL (ref 0.0–40.0)

## 2013-04-21 LAB — VITAMIN B12: VITAMIN B 12: 302 pg/mL (ref 211–911)

## 2013-04-21 LAB — HEPATIC FUNCTION PANEL
ALBUMIN: 4.4 g/dL (ref 3.5–5.2)
ALK PHOS: 46 U/L (ref 39–117)
ALT: 29 U/L (ref 0–53)
AST: 20 U/L (ref 0–37)
BILIRUBIN DIRECT: 0.1 mg/dL (ref 0.0–0.3)
TOTAL PROTEIN: 7.2 g/dL (ref 6.0–8.3)
Total Bilirubin: 0.9 mg/dL (ref 0.3–1.2)

## 2013-04-21 LAB — TSH: TSH: 1.12 u[IU]/mL (ref 0.35–5.50)

## 2013-04-21 NOTE — Progress Notes (Signed)
   Subjective:    Patient ID: Omar Green, male    DOB: 11-26-1985, 26 y.o.   MRN: [id number]  HPI Patient seen for complete physical. 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 He has no chronic medical problems. He takes no regular medications. Last tetanus was 2008. His forms are acquiring hepatitis C antibody screen. He does not have any [place] at risk factors for tuberculosis but is requiring screening. Patient is a vegetarian. He is not a straight vegetarian but eats very little dairy products. Requesting B12 testing. No concerning symptoms. Nonsmoker. No chronic medical problems.  Past Medical History  Diagnosis Date  . 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 GERD 02/20/2009    Qualifier: Diagnosis of  By: Valma Cava LPN, Izora Gala    . Concussion    No past surgical history on file.  reports that he has never smoked. He does not have any smokeless tobacco history on file. His alcohol and drug histories are not on file. family history includes Arthritis in his father and mother; Diabetes in his father; Hyperlipidemia in his father and mother; Hypertension in his father and mother; Mental illness in his father; Stroke in his other. There is no history of Heart attack. No Known Allergies    Review of Systems  Constitutional: Negative for fever, activity change, appetite change, fatigue and unexpected weight change.  HENT: Negative for congestion, ear pain and trouble swallowing.   Eyes: Negative for pain and visual disturbance.  Respiratory: Negative for cough, shortness of breath and wheezing.   Cardiovascular: Negative for chest pain and palpitations.  Gastrointestinal: Negative for nausea, vomiting, abdominal pain, diarrhea, constipation, blood in stool, abdominal distention and rectal pain.  Genitourinary: Negative  for dysuria, hematuria and testicular pain.  Musculoskeletal: Negative for arthralgias and joint swelling.  Skin: Negative for rash.  Neurological: Negative for dizziness, syncope and headaches.  Hematological: Negative for adenopathy.  Psychiatric/Behavioral: Negative for confusion and dysphoric mood.       Objective:   Physical Exam  Constitutional: He is oriented to person, place, and time. He appears well-developed and well-nourished. No distress.  HENT:  Head: Normocephalic and atraumatic.  Right Ear: External ear normal.  Left Ear: External ear normal.  Mouth/Throat: Oropharynx is clear and moist.  Eyes: Conjunctivae and EOM are normal. Pupils are equal, round, and reactive to light.  Neck: Normal range of motion. Neck supple. No thyromegaly present.  Cardiovascular: Normal rate, regular rhythm and normal heart sounds.   No murmur heard. Pulmonary/Chest: No respiratory distress. He has no wheezes. He has no rales.  Abdominal: Soft. Bowel sounds are normal. He exhibits no distension and no mass. There is no tenderness. There is no rebound and no guarding.  Genitourinary:  Testes are normal. No mass. No hernia.  Musculoskeletal: He exhibits no edema.  Lymphadenopathy:    He has no cervical adenopathy.  Neurological: He is alert and oriented to person, place, and time. He displays normal reflexes. No cranial nerve deficit.  Skin: No rash noted.  Psychiatric: He has a normal mood and affect.          Assessment & Plan:  Complete physical. Screening labs obtained. Add hepatitis C antibody screening per requirements. Place skin PPD.

## 2013-04-21 NOTE — Progress Notes (Signed)
Pre visit review using our clinic review tool, if applicable. No additional management support is needed unless otherwise documented below in the visit note. 

## 2013-04-22 LAB — HEPATITIS C ANTIBODY: HCV AB: NEGATIVE

## 2013-05-02 ENCOUNTER — Ambulatory Visit (INDEPENDENT_AMBULATORY_CARE_PROVIDER_SITE_OTHER): Payer: Self-pay

## 2013-05-02 DIAGNOSIS — F4320 Adjustment disorder, unspecified: Secondary | ICD-10-CM

## 2013-06-06 ENCOUNTER — Ambulatory Visit (INDEPENDENT_AMBULATORY_CARE_PROVIDER_SITE_OTHER): Payer: Self-pay

## 2013-06-06 DIAGNOSIS — F4320 Adjustment disorder, unspecified: Secondary | ICD-10-CM

## 2013-06-08 ENCOUNTER — Encounter: Payer: Self-pay | Admitting: Family Medicine

## 2013-06-08 ENCOUNTER — Ambulatory Visit (INDEPENDENT_AMBULATORY_CARE_PROVIDER_SITE_OTHER): Payer: BC Managed Care – PPO | Admitting: Family Medicine

## 2013-06-08 VITALS — BP 100/68 | HR 64 | Temp 97.5°F | Wt 155.0 lb

## 2013-06-08 DIAGNOSIS — B349 Viral infection, unspecified: Secondary | ICD-10-CM

## 2013-06-08 DIAGNOSIS — B9789 Other viral agents as the cause of diseases classified elsewhere: Secondary | ICD-10-CM

## 2013-06-08 NOTE — Progress Notes (Signed)
   Subjective:    Patient ID: Omar Green, male    DOB: 05/11/1985, 28 y.o.   MRN: [id number]  Cough Pertinent negatives include no chills, fever or sore throat.   Patient seen for recent illness. He was traveling in [place] and developed sore throat and low-grade fever along with cough. His girlfriend had similar symptoms. He went to a clinic there and apparently received 1 g of ceftriaxone along with Levaquin and possibly an antihistamine. His sore throat and cough symptoms are improved. He states he had some nausea and diarrhea for a few days preceding the respiratory symptoms. He has some mild nausea and cramping but his diarrhea has resolved. Generally very healthy with no chronic medical problems.  Past Medical History  Diagnosis Date  . 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 GERD 02/20/2009    Qualifier: Diagnosis of  By: Valma Cava LPN, Izora Gala    . Concussion    No past surgical history on file.  reports that he has never smoked. He does not have any smokeless tobacco history on file. His alcohol and drug histories are not on file. family history includes Arthritis in his father and mother; Diabetes in his father; Hyperlipidemia in his father and mother; Hypertension in his father and mother; Mental illness in his father; Stroke in his other. There is no history of Heart attack. No Known Allergies    Review of Systems  Constitutional: Negative for fever and chills.  HENT: Negative for congestion and sore throat.   Respiratory: Positive for cough.   Gastrointestinal: Positive for nausea and abdominal pain. Negative for vomiting and blood in stool.       Objective:   Physical Exam  Constitutional: He appears well-developed and well-nourished.  HENT:  Right Ear: External ear normal.  Left Ear: External ear normal.  Mouth/Throat: Oropharynx is clear and  moist.  Neck: Neck supple.  Cardiovascular: Normal rate.   Pulmonary/Chest: Effort normal and breath sounds normal. No respiratory distress. He has no wheezes. He has no rales.  Abdominal: Soft. Bowel sounds are normal. He exhibits no distension and no mass. There is no tenderness. There is no rebound and no guarding.  Lymphadenopathy:    He has no cervical adenopathy.  Skin: No rash noted.          Assessment & Plan:  Probable resolving viral illness. Sounds like he had upper respiratory symptoms and nausea and diarrhea and both of these are resolving. No further antibiotics at this time. Stay well hydrated and followup promptly for any recurrent fever or other new symptoms

## 2013-06-08 NOTE — Patient Instructions (Signed)
Follow up for any recurrent fever or GI symptoms.

## 2013-06-08 NOTE — Progress Notes (Signed)
Pre visit review using our clinic review tool, if applicable. No additional management support is needed unless otherwise documented below in the visit note. 

## 2013-06-15 ENCOUNTER — Ambulatory Visit (INDEPENDENT_AMBULATORY_CARE_PROVIDER_SITE_OTHER): Payer: Self-pay

## 2013-06-15 ENCOUNTER — Ambulatory Visit: Payer: Self-pay

## 2013-06-15 DIAGNOSIS — F4320 Adjustment disorder, unspecified: Secondary | ICD-10-CM

## 2014-08-14 IMAGING — CT CT HEAD W/O CM
1 series · 16 of 30 positions shown, 20 images · non-contrast
Comparison: None.

CLINICAL DATA: Unilateral occipital headache since trauma 1 year
ago.  Slight dizziness.

CT HEAD WITHOUT CONTRAST
TECHNIQUE: Contiguous axial images were obtained from the base of
the skull through the vertex without contrast.

[Series 2: head_seq -c 4.5 h37s st · axial · 0.46mm/px · z∈[-107,+37]mm · 16 of 36 slices shown, 20 images]
[im 2/36  brain]
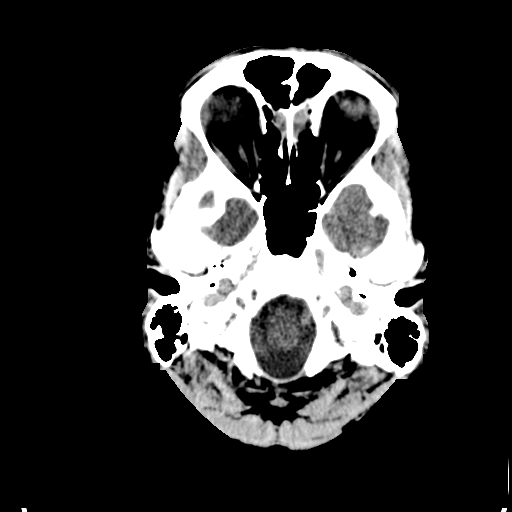
[im 2/36  bone]
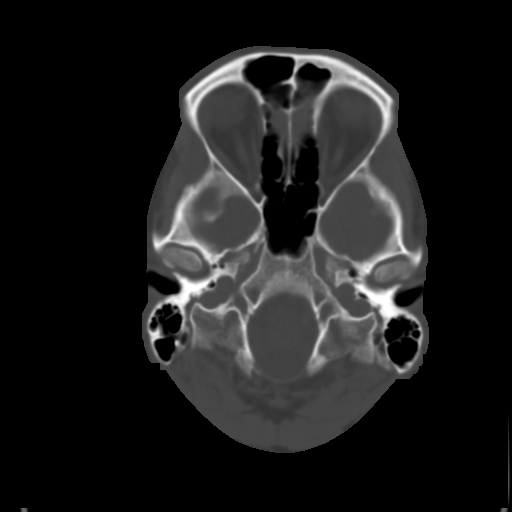
[im 4/36  brain]
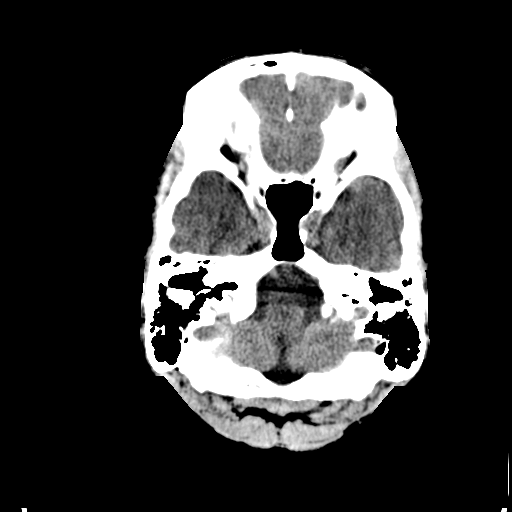
[im 7/36  brain]
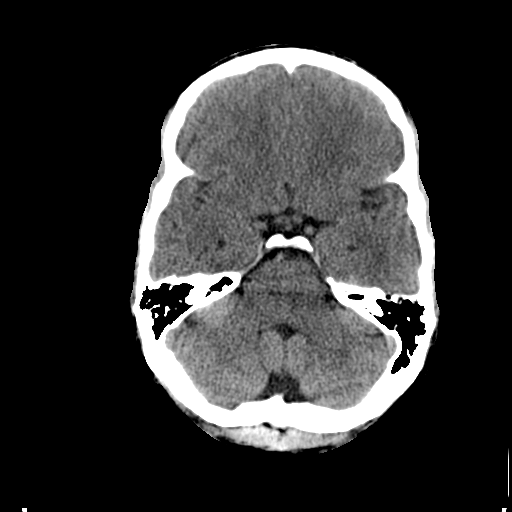
[im 9/36  brain]
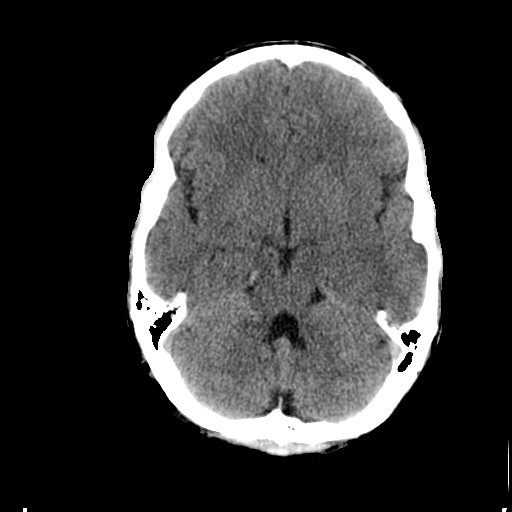
[im 10/36  brain]
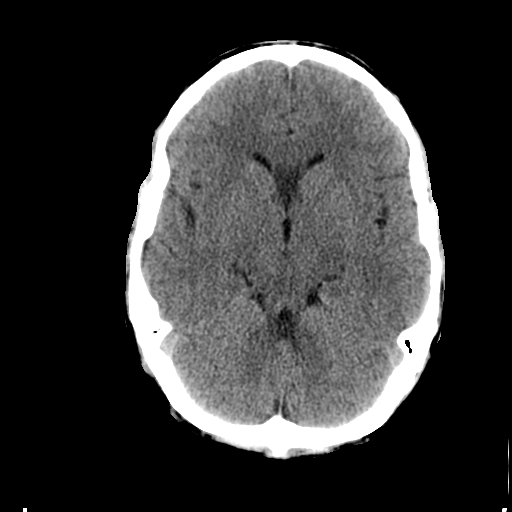
[im 10/36  bone]
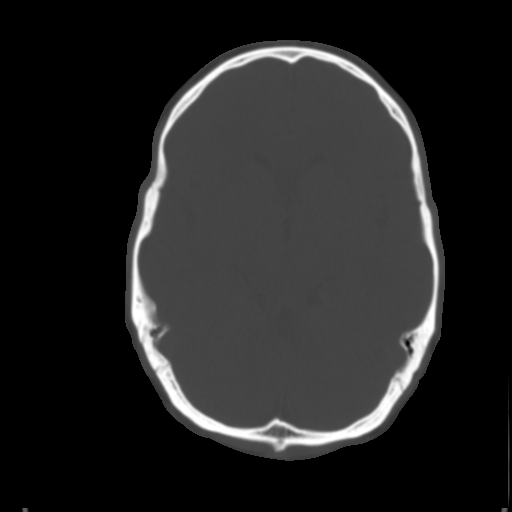
[im 13/36  brain]
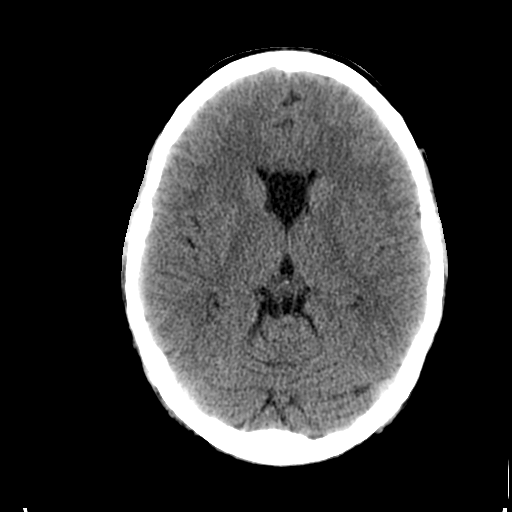
[im 15/36  brain]
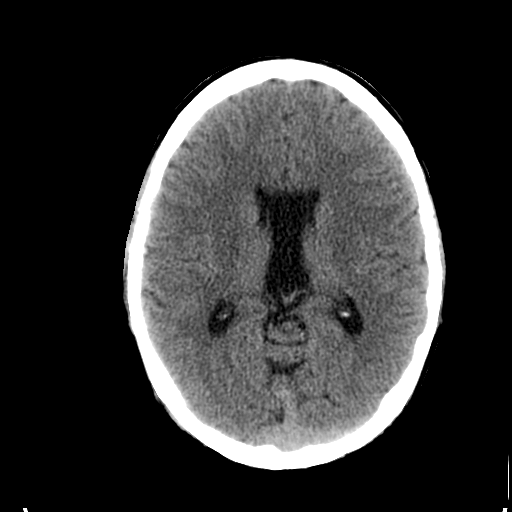
[im 17/36  brain]
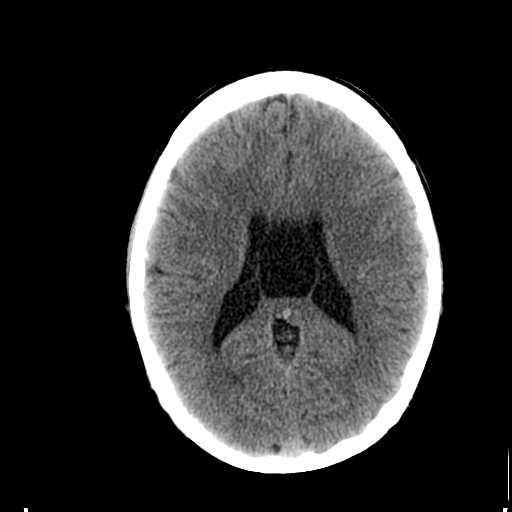
[im 19/36  brain]
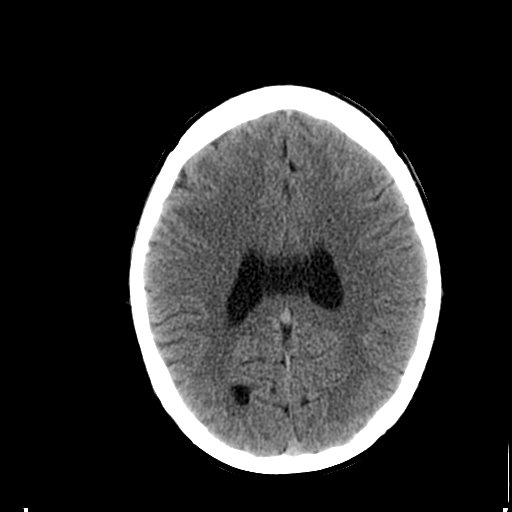
[im 19/36  bone]
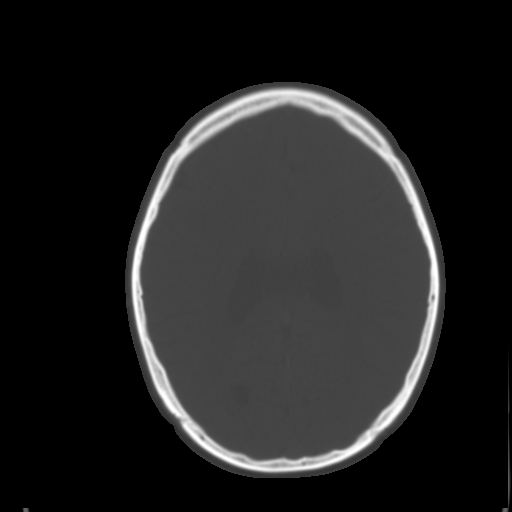
[im 21/36  brain]
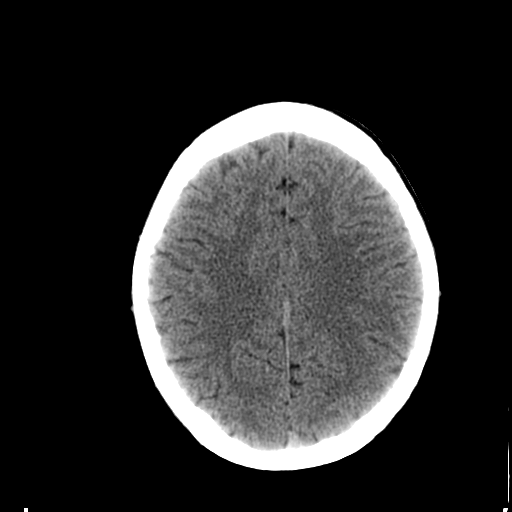
[im 23/36  brain]
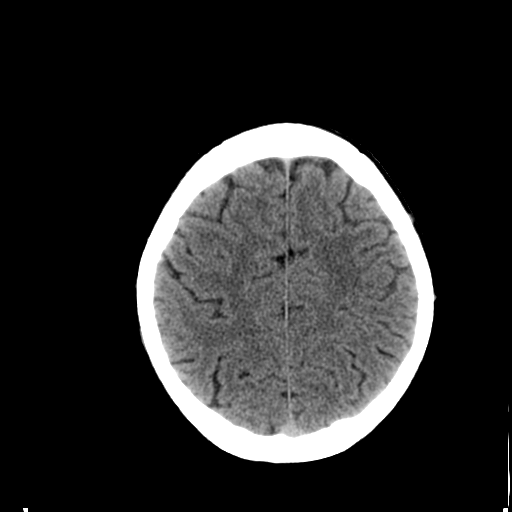
[im 26/36  brain]
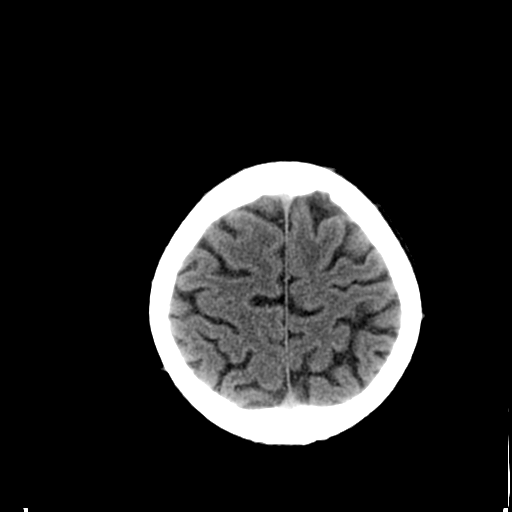
[im 27/36  brain]
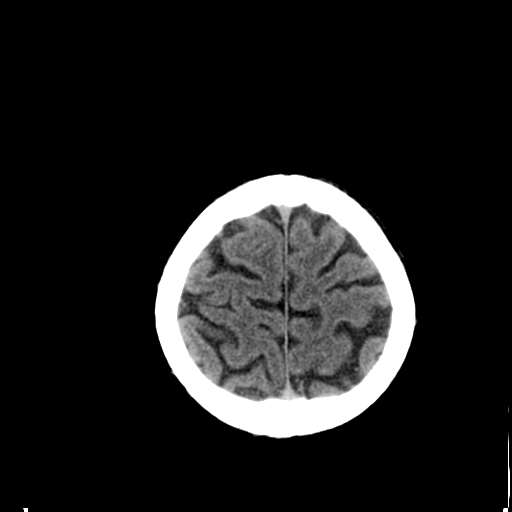
[im 27/36  bone]
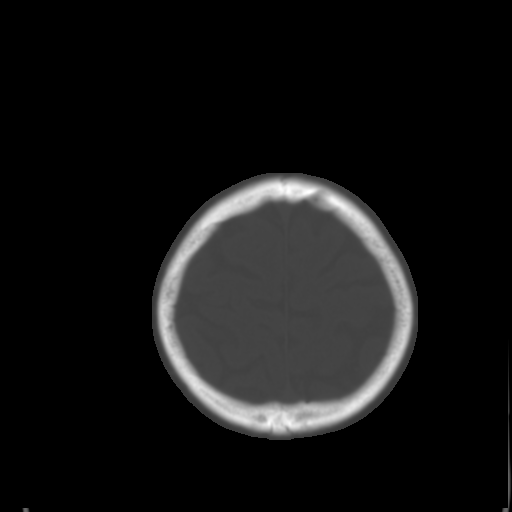
[im 29/36  brain]
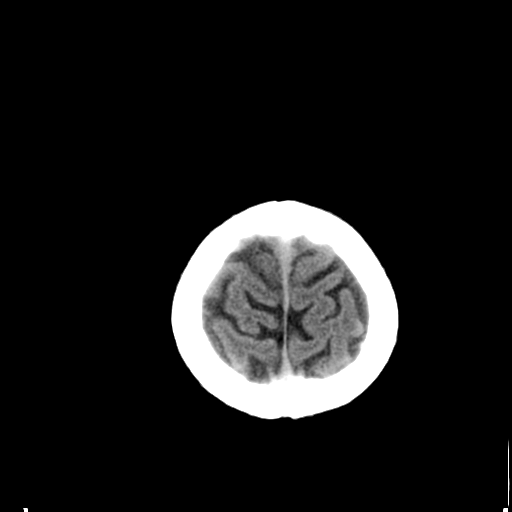
[im 32/36  brain]
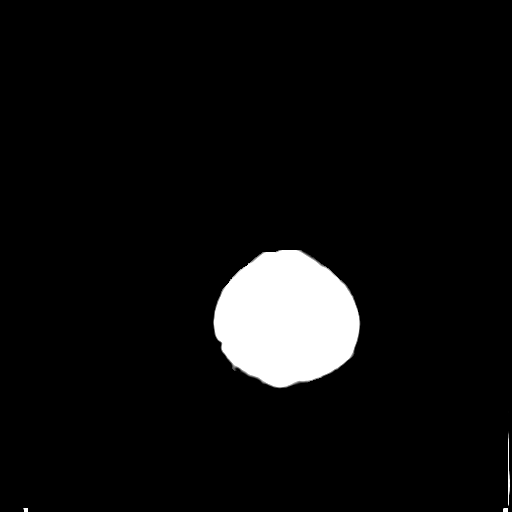
[im 34/36  brain]
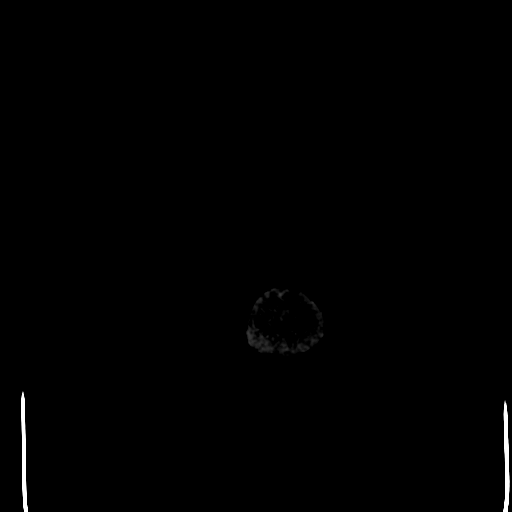

[16 of 30 positions shown; findings below may reference images not displayed]

FINDINGS: There is no evidence for acute infarction, intracranial
hemorrhage, mass lesion, hydrocephalus, or extra-axial fluid.
There is no atrophy or white matter disease.  Incidental cavum
septum pellucidum and vergae.  Calvarium intact.  No significant
vascular calcification.  No acute sinus or mastoid pathology.
IMPRESSION: Negative exam.  No acute intracranial findings.
